# Patient Record
Sex: Female | Born: 1950 | Race: White | Hispanic: No
Health system: Southern US, Community
[De-identification: ages and names within clinical notes are randomized; demographics above are authoritative.]

---

## 2016-10-17 DIAGNOSIS — D1809 Hemangioma of other sites: Secondary | ICD-10-CM | POA: Diagnosis not present

## 2016-10-17 DIAGNOSIS — R2689 Other abnormalities of gait and mobility: Secondary | ICD-10-CM | POA: Diagnosis not present

## 2016-10-17 DIAGNOSIS — Z96642 Presence of left artificial hip joint: Secondary | ICD-10-CM | POA: Diagnosis not present

## 2016-10-17 DIAGNOSIS — M25552 Pain in left hip: Secondary | ICD-10-CM | POA: Diagnosis not present

## 2016-10-19 DIAGNOSIS — R2689 Other abnormalities of gait and mobility: Secondary | ICD-10-CM | POA: Diagnosis not present

## 2016-10-19 DIAGNOSIS — Z96642 Presence of left artificial hip joint: Secondary | ICD-10-CM | POA: Diagnosis not present

## 2016-10-19 DIAGNOSIS — D1809 Hemangioma of other sites: Secondary | ICD-10-CM | POA: Diagnosis not present

## 2016-10-19 DIAGNOSIS — M25552 Pain in left hip: Secondary | ICD-10-CM | POA: Diagnosis not present

## 2016-10-24 DIAGNOSIS — D1809 Hemangioma of other sites: Secondary | ICD-10-CM | POA: Diagnosis not present

## 2016-10-24 DIAGNOSIS — Z96642 Presence of left artificial hip joint: Secondary | ICD-10-CM | POA: Diagnosis not present

## 2016-10-24 DIAGNOSIS — R2689 Other abnormalities of gait and mobility: Secondary | ICD-10-CM | POA: Diagnosis not present

## 2016-10-24 DIAGNOSIS — M25552 Pain in left hip: Secondary | ICD-10-CM | POA: Diagnosis not present

## 2016-10-26 DIAGNOSIS — M25552 Pain in left hip: Secondary | ICD-10-CM | POA: Diagnosis not present

## 2016-10-26 DIAGNOSIS — R2689 Other abnormalities of gait and mobility: Secondary | ICD-10-CM | POA: Diagnosis not present

## 2016-10-26 DIAGNOSIS — D1809 Hemangioma of other sites: Secondary | ICD-10-CM | POA: Diagnosis not present

## 2016-10-26 DIAGNOSIS — Z96642 Presence of left artificial hip joint: Secondary | ICD-10-CM | POA: Diagnosis not present

## 2016-10-31 DIAGNOSIS — D1809 Hemangioma of other sites: Secondary | ICD-10-CM | POA: Diagnosis not present

## 2016-10-31 DIAGNOSIS — M25552 Pain in left hip: Secondary | ICD-10-CM | POA: Diagnosis not present

## 2016-10-31 DIAGNOSIS — Z96642 Presence of left artificial hip joint: Secondary | ICD-10-CM | POA: Diagnosis not present

## 2016-10-31 DIAGNOSIS — R2689 Other abnormalities of gait and mobility: Secondary | ICD-10-CM | POA: Diagnosis not present

## 2016-11-02 DIAGNOSIS — Z96642 Presence of left artificial hip joint: Secondary | ICD-10-CM | POA: Diagnosis not present

## 2016-11-02 DIAGNOSIS — R2689 Other abnormalities of gait and mobility: Secondary | ICD-10-CM | POA: Diagnosis not present

## 2016-11-02 DIAGNOSIS — M25552 Pain in left hip: Secondary | ICD-10-CM | POA: Diagnosis not present

## 2016-11-02 DIAGNOSIS — D1809 Hemangioma of other sites: Secondary | ICD-10-CM | POA: Diagnosis not present

## 2016-11-07 DIAGNOSIS — M25552 Pain in left hip: Secondary | ICD-10-CM | POA: Diagnosis not present

## 2016-11-07 DIAGNOSIS — Z96642 Presence of left artificial hip joint: Secondary | ICD-10-CM | POA: Diagnosis not present

## 2016-11-07 DIAGNOSIS — R2689 Other abnormalities of gait and mobility: Secondary | ICD-10-CM | POA: Diagnosis not present

## 2016-11-07 DIAGNOSIS — D1809 Hemangioma of other sites: Secondary | ICD-10-CM | POA: Diagnosis not present

## 2016-11-14 DIAGNOSIS — M25552 Pain in left hip: Secondary | ICD-10-CM | POA: Diagnosis not present

## 2016-11-14 DIAGNOSIS — D1809 Hemangioma of other sites: Secondary | ICD-10-CM | POA: Diagnosis not present

## 2016-11-14 DIAGNOSIS — Z96642 Presence of left artificial hip joint: Secondary | ICD-10-CM | POA: Diagnosis not present

## 2016-11-14 DIAGNOSIS — R2689 Other abnormalities of gait and mobility: Secondary | ICD-10-CM | POA: Diagnosis not present

## 2016-11-16 DIAGNOSIS — D1809 Hemangioma of other sites: Secondary | ICD-10-CM | POA: Diagnosis not present

## 2016-11-16 DIAGNOSIS — M25552 Pain in left hip: Secondary | ICD-10-CM | POA: Diagnosis not present

## 2016-11-16 DIAGNOSIS — R2689 Other abnormalities of gait and mobility: Secondary | ICD-10-CM | POA: Diagnosis not present

## 2016-11-16 DIAGNOSIS — Z96642 Presence of left artificial hip joint: Secondary | ICD-10-CM | POA: Diagnosis not present

## 2016-11-21 DIAGNOSIS — D1809 Hemangioma of other sites: Secondary | ICD-10-CM | POA: Diagnosis not present

## 2016-11-21 DIAGNOSIS — R2689 Other abnormalities of gait and mobility: Secondary | ICD-10-CM | POA: Diagnosis not present

## 2016-11-21 DIAGNOSIS — M25552 Pain in left hip: Secondary | ICD-10-CM | POA: Diagnosis not present

## 2016-11-21 DIAGNOSIS — Z96642 Presence of left artificial hip joint: Secondary | ICD-10-CM | POA: Diagnosis not present

## 2016-11-28 DIAGNOSIS — M25552 Pain in left hip: Secondary | ICD-10-CM | POA: Diagnosis not present

## 2016-11-28 DIAGNOSIS — D1809 Hemangioma of other sites: Secondary | ICD-10-CM | POA: Diagnosis not present

## 2016-11-28 DIAGNOSIS — R2689 Other abnormalities of gait and mobility: Secondary | ICD-10-CM | POA: Diagnosis not present

## 2016-11-28 DIAGNOSIS — Z96642 Presence of left artificial hip joint: Secondary | ICD-10-CM | POA: Diagnosis not present

## 2016-11-30 DIAGNOSIS — Z96642 Presence of left artificial hip joint: Secondary | ICD-10-CM | POA: Diagnosis not present

## 2016-11-30 DIAGNOSIS — R2689 Other abnormalities of gait and mobility: Secondary | ICD-10-CM | POA: Diagnosis not present

## 2016-11-30 DIAGNOSIS — D1809 Hemangioma of other sites: Secondary | ICD-10-CM | POA: Diagnosis not present

## 2016-11-30 DIAGNOSIS — M25552 Pain in left hip: Secondary | ICD-10-CM | POA: Diagnosis not present

## 2016-12-05 DIAGNOSIS — D1809 Hemangioma of other sites: Secondary | ICD-10-CM | POA: Diagnosis not present

## 2016-12-05 DIAGNOSIS — Z96642 Presence of left artificial hip joint: Secondary | ICD-10-CM | POA: Diagnosis not present

## 2016-12-05 DIAGNOSIS — M25552 Pain in left hip: Secondary | ICD-10-CM | POA: Diagnosis not present

## 2016-12-05 DIAGNOSIS — R2689 Other abnormalities of gait and mobility: Secondary | ICD-10-CM | POA: Diagnosis not present

## 2016-12-07 DIAGNOSIS — Z96642 Presence of left artificial hip joint: Secondary | ICD-10-CM | POA: Diagnosis not present

## 2016-12-07 DIAGNOSIS — D1809 Hemangioma of other sites: Secondary | ICD-10-CM | POA: Diagnosis not present

## 2016-12-07 DIAGNOSIS — R2689 Other abnormalities of gait and mobility: Secondary | ICD-10-CM | POA: Diagnosis not present

## 2016-12-07 DIAGNOSIS — M25552 Pain in left hip: Secondary | ICD-10-CM | POA: Diagnosis not present

## 2016-12-13 DIAGNOSIS — R2689 Other abnormalities of gait and mobility: Secondary | ICD-10-CM | POA: Diagnosis not present

## 2016-12-13 DIAGNOSIS — Z96642 Presence of left artificial hip joint: Secondary | ICD-10-CM | POA: Diagnosis not present

## 2016-12-13 DIAGNOSIS — M25552 Pain in left hip: Secondary | ICD-10-CM | POA: Diagnosis not present

## 2016-12-13 DIAGNOSIS — D1809 Hemangioma of other sites: Secondary | ICD-10-CM | POA: Diagnosis not present

## 2016-12-19 DIAGNOSIS — M25552 Pain in left hip: Secondary | ICD-10-CM | POA: Diagnosis not present

## 2016-12-19 DIAGNOSIS — R2689 Other abnormalities of gait and mobility: Secondary | ICD-10-CM | POA: Diagnosis not present

## 2016-12-19 DIAGNOSIS — Z96642 Presence of left artificial hip joint: Secondary | ICD-10-CM | POA: Diagnosis not present

## 2016-12-19 DIAGNOSIS — D1809 Hemangioma of other sites: Secondary | ICD-10-CM | POA: Diagnosis not present

## 2016-12-21 DIAGNOSIS — M25552 Pain in left hip: Secondary | ICD-10-CM | POA: Diagnosis not present

## 2016-12-21 DIAGNOSIS — R2689 Other abnormalities of gait and mobility: Secondary | ICD-10-CM | POA: Diagnosis not present

## 2016-12-21 DIAGNOSIS — Z96642 Presence of left artificial hip joint: Secondary | ICD-10-CM | POA: Diagnosis not present

## 2016-12-21 DIAGNOSIS — D1809 Hemangioma of other sites: Secondary | ICD-10-CM | POA: Diagnosis not present

## 2016-12-26 DIAGNOSIS — Z96642 Presence of left artificial hip joint: Secondary | ICD-10-CM | POA: Diagnosis not present

## 2016-12-26 DIAGNOSIS — D1809 Hemangioma of other sites: Secondary | ICD-10-CM | POA: Diagnosis not present

## 2016-12-26 DIAGNOSIS — M25552 Pain in left hip: Secondary | ICD-10-CM | POA: Diagnosis not present

## 2016-12-26 DIAGNOSIS — R2689 Other abnormalities of gait and mobility: Secondary | ICD-10-CM | POA: Diagnosis not present

## 2016-12-28 DIAGNOSIS — M25552 Pain in left hip: Secondary | ICD-10-CM | POA: Diagnosis not present

## 2016-12-28 DIAGNOSIS — D1809 Hemangioma of other sites: Secondary | ICD-10-CM | POA: Diagnosis not present

## 2016-12-28 DIAGNOSIS — R2689 Other abnormalities of gait and mobility: Secondary | ICD-10-CM | POA: Diagnosis not present

## 2016-12-28 DIAGNOSIS — Z96642 Presence of left artificial hip joint: Secondary | ICD-10-CM | POA: Diagnosis not present

## 2017-01-02 DIAGNOSIS — Z96642 Presence of left artificial hip joint: Secondary | ICD-10-CM | POA: Diagnosis not present

## 2017-01-02 DIAGNOSIS — D1809 Hemangioma of other sites: Secondary | ICD-10-CM | POA: Diagnosis not present

## 2017-01-02 DIAGNOSIS — M25552 Pain in left hip: Secondary | ICD-10-CM | POA: Diagnosis not present

## 2017-01-02 DIAGNOSIS — R2689 Other abnormalities of gait and mobility: Secondary | ICD-10-CM | POA: Diagnosis not present

## 2017-01-09 DIAGNOSIS — R2689 Other abnormalities of gait and mobility: Secondary | ICD-10-CM | POA: Diagnosis not present

## 2017-01-09 DIAGNOSIS — Z96642 Presence of left artificial hip joint: Secondary | ICD-10-CM | POA: Diagnosis not present

## 2017-01-09 DIAGNOSIS — D1809 Hemangioma of other sites: Secondary | ICD-10-CM | POA: Diagnosis not present

## 2017-01-09 DIAGNOSIS — M25552 Pain in left hip: Secondary | ICD-10-CM | POA: Diagnosis not present

## 2017-01-11 DIAGNOSIS — D1809 Hemangioma of other sites: Secondary | ICD-10-CM | POA: Diagnosis not present

## 2017-01-11 DIAGNOSIS — R2689 Other abnormalities of gait and mobility: Secondary | ICD-10-CM | POA: Diagnosis not present

## 2017-01-11 DIAGNOSIS — Z96642 Presence of left artificial hip joint: Secondary | ICD-10-CM | POA: Diagnosis not present

## 2017-01-11 DIAGNOSIS — M25552 Pain in left hip: Secondary | ICD-10-CM | POA: Diagnosis not present

## 2017-01-16 DIAGNOSIS — Z96642 Presence of left artificial hip joint: Secondary | ICD-10-CM | POA: Diagnosis not present

## 2017-01-16 DIAGNOSIS — D1809 Hemangioma of other sites: Secondary | ICD-10-CM | POA: Diagnosis not present

## 2017-01-16 DIAGNOSIS — M25552 Pain in left hip: Secondary | ICD-10-CM | POA: Diagnosis not present

## 2017-01-16 DIAGNOSIS — R2689 Other abnormalities of gait and mobility: Secondary | ICD-10-CM | POA: Diagnosis not present

## 2017-01-18 DIAGNOSIS — D1809 Hemangioma of other sites: Secondary | ICD-10-CM | POA: Diagnosis not present

## 2017-01-18 DIAGNOSIS — M25552 Pain in left hip: Secondary | ICD-10-CM | POA: Diagnosis not present

## 2017-01-18 DIAGNOSIS — R2689 Other abnormalities of gait and mobility: Secondary | ICD-10-CM | POA: Diagnosis not present

## 2017-01-18 DIAGNOSIS — Z96642 Presence of left artificial hip joint: Secondary | ICD-10-CM | POA: Diagnosis not present

## 2017-01-23 DIAGNOSIS — R2689 Other abnormalities of gait and mobility: Secondary | ICD-10-CM | POA: Diagnosis not present

## 2017-01-23 DIAGNOSIS — Z96642 Presence of left artificial hip joint: Secondary | ICD-10-CM | POA: Diagnosis not present

## 2017-01-23 DIAGNOSIS — M25552 Pain in left hip: Secondary | ICD-10-CM | POA: Diagnosis not present

## 2017-01-23 DIAGNOSIS — D1809 Hemangioma of other sites: Secondary | ICD-10-CM | POA: Diagnosis not present

## 2017-01-25 DIAGNOSIS — R2689 Other abnormalities of gait and mobility: Secondary | ICD-10-CM | POA: Diagnosis not present

## 2017-01-25 DIAGNOSIS — M25552 Pain in left hip: Secondary | ICD-10-CM | POA: Diagnosis not present

## 2017-01-25 DIAGNOSIS — D1809 Hemangioma of other sites: Secondary | ICD-10-CM | POA: Diagnosis not present

## 2017-01-25 DIAGNOSIS — Z96642 Presence of left artificial hip joint: Secondary | ICD-10-CM | POA: Diagnosis not present

## 2017-01-27 DIAGNOSIS — D1800 Hemangioma unspecified site: Secondary | ICD-10-CM | POA: Diagnosis not present

## 2017-01-27 DIAGNOSIS — Z87891 Personal history of nicotine dependence: Secondary | ICD-10-CM | POA: Diagnosis not present

## 2017-01-27 DIAGNOSIS — M899 Disorder of bone, unspecified: Secondary | ICD-10-CM | POA: Diagnosis not present

## 2017-01-27 DIAGNOSIS — T8131XA Disruption of external operation (surgical) wound, not elsewhere classified, initial encounter: Secondary | ICD-10-CM | POA: Diagnosis not present

## 2017-01-27 DIAGNOSIS — Z79899 Other long term (current) drug therapy: Secondary | ICD-10-CM | POA: Diagnosis not present

## 2017-01-27 DIAGNOSIS — C419 Malignant neoplasm of bone and articular cartilage, unspecified: Secondary | ICD-10-CM | POA: Diagnosis not present

## 2017-01-27 DIAGNOSIS — J45909 Unspecified asthma, uncomplicated: Secondary | ICD-10-CM | POA: Diagnosis not present

## 2017-01-27 DIAGNOSIS — E785 Hyperlipidemia, unspecified: Secondary | ICD-10-CM | POA: Diagnosis not present

## 2017-01-27 DIAGNOSIS — Z96642 Presence of left artificial hip joint: Secondary | ICD-10-CM | POA: Diagnosis not present

## 2017-01-27 DIAGNOSIS — Z471 Aftercare following joint replacement surgery: Secondary | ICD-10-CM | POA: Diagnosis not present

## 2017-01-30 DIAGNOSIS — M899 Disorder of bone, unspecified: Secondary | ICD-10-CM | POA: Diagnosis not present

## 2017-01-30 DIAGNOSIS — J45909 Unspecified asthma, uncomplicated: Secondary | ICD-10-CM | POA: Diagnosis not present

## 2017-01-30 DIAGNOSIS — C419 Malignant neoplasm of bone and articular cartilage, unspecified: Secondary | ICD-10-CM | POA: Diagnosis not present

## 2017-01-30 DIAGNOSIS — Z87891 Personal history of nicotine dependence: Secondary | ICD-10-CM | POA: Diagnosis not present

## 2017-01-30 DIAGNOSIS — D1800 Hemangioma unspecified site: Secondary | ICD-10-CM | POA: Diagnosis not present

## 2017-01-30 DIAGNOSIS — T8131XA Disruption of external operation (surgical) wound, not elsewhere classified, initial encounter: Secondary | ICD-10-CM | POA: Diagnosis not present

## 2017-01-30 DIAGNOSIS — Z79899 Other long term (current) drug therapy: Secondary | ICD-10-CM | POA: Diagnosis not present

## 2017-01-30 DIAGNOSIS — Z96642 Presence of left artificial hip joint: Secondary | ICD-10-CM | POA: Diagnosis not present

## 2017-01-30 DIAGNOSIS — Z471 Aftercare following joint replacement surgery: Secondary | ICD-10-CM | POA: Diagnosis not present

## 2017-01-30 DIAGNOSIS — E785 Hyperlipidemia, unspecified: Secondary | ICD-10-CM | POA: Diagnosis not present

## 2017-02-01 DIAGNOSIS — Z96642 Presence of left artificial hip joint: Secondary | ICD-10-CM | POA: Diagnosis not present

## 2017-02-01 DIAGNOSIS — R2689 Other abnormalities of gait and mobility: Secondary | ICD-10-CM | POA: Diagnosis not present

## 2017-02-01 DIAGNOSIS — D1809 Hemangioma of other sites: Secondary | ICD-10-CM | POA: Diagnosis not present

## 2017-02-01 DIAGNOSIS — M25552 Pain in left hip: Secondary | ICD-10-CM | POA: Diagnosis not present

## 2017-02-06 DIAGNOSIS — M25552 Pain in left hip: Secondary | ICD-10-CM | POA: Diagnosis not present

## 2017-02-06 DIAGNOSIS — R2689 Other abnormalities of gait and mobility: Secondary | ICD-10-CM | POA: Diagnosis not present

## 2017-02-06 DIAGNOSIS — D1809 Hemangioma of other sites: Secondary | ICD-10-CM | POA: Diagnosis not present

## 2017-02-06 DIAGNOSIS — Z96642 Presence of left artificial hip joint: Secondary | ICD-10-CM | POA: Diagnosis not present

## 2017-02-13 DIAGNOSIS — R2689 Other abnormalities of gait and mobility: Secondary | ICD-10-CM | POA: Diagnosis not present

## 2017-02-13 DIAGNOSIS — D1809 Hemangioma of other sites: Secondary | ICD-10-CM | POA: Diagnosis not present

## 2017-02-13 DIAGNOSIS — M25552 Pain in left hip: Secondary | ICD-10-CM | POA: Diagnosis not present

## 2017-02-13 DIAGNOSIS — Z96642 Presence of left artificial hip joint: Secondary | ICD-10-CM | POA: Diagnosis not present

## 2017-02-15 DIAGNOSIS — D1809 Hemangioma of other sites: Secondary | ICD-10-CM | POA: Diagnosis not present

## 2017-02-15 DIAGNOSIS — M25552 Pain in left hip: Secondary | ICD-10-CM | POA: Diagnosis not present

## 2017-02-15 DIAGNOSIS — Z96642 Presence of left artificial hip joint: Secondary | ICD-10-CM | POA: Diagnosis not present

## 2017-02-15 DIAGNOSIS — R2689 Other abnormalities of gait and mobility: Secondary | ICD-10-CM | POA: Diagnosis not present

## 2017-02-20 DIAGNOSIS — R2689 Other abnormalities of gait and mobility: Secondary | ICD-10-CM | POA: Diagnosis not present

## 2017-02-20 DIAGNOSIS — D1809 Hemangioma of other sites: Secondary | ICD-10-CM | POA: Diagnosis not present

## 2017-02-20 DIAGNOSIS — M25552 Pain in left hip: Secondary | ICD-10-CM | POA: Diagnosis not present

## 2017-02-20 DIAGNOSIS — Z96642 Presence of left artificial hip joint: Secondary | ICD-10-CM | POA: Diagnosis not present

## 2017-02-22 DIAGNOSIS — R2689 Other abnormalities of gait and mobility: Secondary | ICD-10-CM | POA: Diagnosis not present

## 2017-02-22 DIAGNOSIS — M25552 Pain in left hip: Secondary | ICD-10-CM | POA: Diagnosis not present

## 2017-02-22 DIAGNOSIS — Z96642 Presence of left artificial hip joint: Secondary | ICD-10-CM | POA: Diagnosis not present

## 2017-02-22 DIAGNOSIS — D1809 Hemangioma of other sites: Secondary | ICD-10-CM | POA: Diagnosis not present

## 2017-02-27 DIAGNOSIS — D1809 Hemangioma of other sites: Secondary | ICD-10-CM | POA: Diagnosis not present

## 2017-02-27 DIAGNOSIS — R2689 Other abnormalities of gait and mobility: Secondary | ICD-10-CM | POA: Diagnosis not present

## 2017-02-27 DIAGNOSIS — M25552 Pain in left hip: Secondary | ICD-10-CM | POA: Diagnosis not present

## 2017-02-27 DIAGNOSIS — Z96642 Presence of left artificial hip joint: Secondary | ICD-10-CM | POA: Diagnosis not present

## 2017-02-28 DIAGNOSIS — M791 Myalgia: Secondary | ICD-10-CM | POA: Diagnosis not present

## 2017-02-28 DIAGNOSIS — M5430 Sciatica, unspecified side: Secondary | ICD-10-CM | POA: Diagnosis not present

## 2017-02-28 DIAGNOSIS — G9050 Complex regional pain syndrome I, unspecified: Secondary | ICD-10-CM | POA: Diagnosis not present

## 2017-02-28 DIAGNOSIS — C419 Malignant neoplasm of bone and articular cartilage, unspecified: Secondary | ICD-10-CM | POA: Diagnosis not present

## 2017-02-28 DIAGNOSIS — M79662 Pain in left lower leg: Secondary | ICD-10-CM | POA: Diagnosis not present

## 2017-02-28 DIAGNOSIS — G5710 Meralgia paresthetica, unspecified lower limb: Secondary | ICD-10-CM | POA: Diagnosis not present

## 2017-02-28 DIAGNOSIS — Z806 Family history of leukemia: Secondary | ICD-10-CM | POA: Diagnosis not present

## 2017-02-28 DIAGNOSIS — Z801 Family history of malignant neoplasm of trachea, bronchus and lung: Secondary | ICD-10-CM | POA: Diagnosis not present

## 2017-02-28 DIAGNOSIS — M792 Neuralgia and neuritis, unspecified: Secondary | ICD-10-CM | POA: Diagnosis not present

## 2017-03-08 DIAGNOSIS — D1800 Hemangioma unspecified site: Secondary | ICD-10-CM | POA: Diagnosis not present

## 2017-03-08 DIAGNOSIS — G5710 Meralgia paresthetica, unspecified lower limb: Secondary | ICD-10-CM | POA: Diagnosis not present

## 2017-03-08 DIAGNOSIS — C419 Malignant neoplasm of bone and articular cartilage, unspecified: Secondary | ICD-10-CM | POA: Diagnosis not present

## 2017-03-08 DIAGNOSIS — M792 Neuralgia and neuritis, unspecified: Secondary | ICD-10-CM | POA: Diagnosis not present

## 2017-03-08 DIAGNOSIS — G9050 Complex regional pain syndrome I, unspecified: Secondary | ICD-10-CM | POA: Diagnosis not present

## 2017-03-08 DIAGNOSIS — M791 Myalgia: Secondary | ICD-10-CM | POA: Diagnosis not present

## 2017-03-08 DIAGNOSIS — M79605 Pain in left leg: Secondary | ICD-10-CM | POA: Diagnosis not present

## 2017-03-13 DIAGNOSIS — M25552 Pain in left hip: Secondary | ICD-10-CM | POA: Diagnosis not present

## 2017-03-13 DIAGNOSIS — D1809 Hemangioma of other sites: Secondary | ICD-10-CM | POA: Diagnosis not present

## 2017-03-13 DIAGNOSIS — Z96642 Presence of left artificial hip joint: Secondary | ICD-10-CM | POA: Diagnosis not present

## 2017-03-13 DIAGNOSIS — R2689 Other abnormalities of gait and mobility: Secondary | ICD-10-CM | POA: Diagnosis not present

## 2017-03-20 DIAGNOSIS — R2689 Other abnormalities of gait and mobility: Secondary | ICD-10-CM | POA: Diagnosis not present

## 2017-03-20 DIAGNOSIS — M25552 Pain in left hip: Secondary | ICD-10-CM | POA: Diagnosis not present

## 2017-03-20 DIAGNOSIS — Z96642 Presence of left artificial hip joint: Secondary | ICD-10-CM | POA: Diagnosis not present

## 2017-03-20 DIAGNOSIS — D1809 Hemangioma of other sites: Secondary | ICD-10-CM | POA: Diagnosis not present

## 2017-03-22 DIAGNOSIS — D1809 Hemangioma of other sites: Secondary | ICD-10-CM | POA: Diagnosis not present

## 2017-03-22 DIAGNOSIS — Z96642 Presence of left artificial hip joint: Secondary | ICD-10-CM | POA: Diagnosis not present

## 2017-03-22 DIAGNOSIS — M25552 Pain in left hip: Secondary | ICD-10-CM | POA: Diagnosis not present

## 2017-03-22 DIAGNOSIS — R2689 Other abnormalities of gait and mobility: Secondary | ICD-10-CM | POA: Diagnosis not present

## 2017-03-27 DIAGNOSIS — M25552 Pain in left hip: Secondary | ICD-10-CM | POA: Diagnosis not present

## 2017-03-27 DIAGNOSIS — Z96642 Presence of left artificial hip joint: Secondary | ICD-10-CM | POA: Diagnosis not present

## 2017-03-27 DIAGNOSIS — R2689 Other abnormalities of gait and mobility: Secondary | ICD-10-CM | POA: Diagnosis not present

## 2017-03-27 DIAGNOSIS — D1809 Hemangioma of other sites: Secondary | ICD-10-CM | POA: Diagnosis not present

## 2017-03-29 DIAGNOSIS — R2689 Other abnormalities of gait and mobility: Secondary | ICD-10-CM | POA: Diagnosis not present

## 2017-03-29 DIAGNOSIS — D1809 Hemangioma of other sites: Secondary | ICD-10-CM | POA: Diagnosis not present

## 2017-03-29 DIAGNOSIS — M25552 Pain in left hip: Secondary | ICD-10-CM | POA: Diagnosis not present

## 2017-03-29 DIAGNOSIS — Z96642 Presence of left artificial hip joint: Secondary | ICD-10-CM | POA: Diagnosis not present

## 2017-04-03 DIAGNOSIS — Z96642 Presence of left artificial hip joint: Secondary | ICD-10-CM | POA: Diagnosis not present

## 2017-04-03 DIAGNOSIS — D1809 Hemangioma of other sites: Secondary | ICD-10-CM | POA: Diagnosis not present

## 2017-04-03 DIAGNOSIS — M25552 Pain in left hip: Secondary | ICD-10-CM | POA: Diagnosis not present

## 2017-04-03 DIAGNOSIS — R2689 Other abnormalities of gait and mobility: Secondary | ICD-10-CM | POA: Diagnosis not present

## 2017-04-04 DIAGNOSIS — Z86018 Personal history of other benign neoplasm: Secondary | ICD-10-CM | POA: Diagnosis not present

## 2017-04-04 DIAGNOSIS — M792 Neuralgia and neuritis, unspecified: Secondary | ICD-10-CM | POA: Diagnosis not present

## 2017-04-04 DIAGNOSIS — M791 Myalgia: Secondary | ICD-10-CM | POA: Diagnosis not present

## 2017-04-04 DIAGNOSIS — C419 Malignant neoplasm of bone and articular cartilage, unspecified: Secondary | ICD-10-CM | POA: Diagnosis not present

## 2017-04-04 DIAGNOSIS — J45909 Unspecified asthma, uncomplicated: Secondary | ICD-10-CM | POA: Diagnosis not present

## 2017-04-04 DIAGNOSIS — G5710 Meralgia paresthetica, unspecified lower limb: Secondary | ICD-10-CM | POA: Diagnosis not present

## 2017-04-04 DIAGNOSIS — G9050 Complex regional pain syndrome I, unspecified: Secondary | ICD-10-CM | POA: Diagnosis not present

## 2017-04-04 DIAGNOSIS — M79662 Pain in left lower leg: Secondary | ICD-10-CM | POA: Diagnosis not present

## 2017-04-10 DIAGNOSIS — D1809 Hemangioma of other sites: Secondary | ICD-10-CM | POA: Diagnosis not present

## 2017-04-10 DIAGNOSIS — R2689 Other abnormalities of gait and mobility: Secondary | ICD-10-CM | POA: Diagnosis not present

## 2017-04-10 DIAGNOSIS — M25552 Pain in left hip: Secondary | ICD-10-CM | POA: Diagnosis not present

## 2017-04-10 DIAGNOSIS — Z96642 Presence of left artificial hip joint: Secondary | ICD-10-CM | POA: Diagnosis not present

## 2017-04-19 DIAGNOSIS — Z96642 Presence of left artificial hip joint: Secondary | ICD-10-CM | POA: Diagnosis not present

## 2017-04-19 DIAGNOSIS — M25552 Pain in left hip: Secondary | ICD-10-CM | POA: Diagnosis not present

## 2017-04-19 DIAGNOSIS — D1809 Hemangioma of other sites: Secondary | ICD-10-CM | POA: Diagnosis not present

## 2017-04-19 DIAGNOSIS — R2689 Other abnormalities of gait and mobility: Secondary | ICD-10-CM | POA: Diagnosis not present

## 2017-04-24 DIAGNOSIS — R2689 Other abnormalities of gait and mobility: Secondary | ICD-10-CM | POA: Diagnosis not present

## 2017-04-24 DIAGNOSIS — M25552 Pain in left hip: Secondary | ICD-10-CM | POA: Diagnosis not present

## 2017-04-24 DIAGNOSIS — D1809 Hemangioma of other sites: Secondary | ICD-10-CM | POA: Diagnosis not present

## 2017-04-24 DIAGNOSIS — Z96642 Presence of left artificial hip joint: Secondary | ICD-10-CM | POA: Diagnosis not present

## 2017-04-26 DIAGNOSIS — R2689 Other abnormalities of gait and mobility: Secondary | ICD-10-CM | POA: Diagnosis not present

## 2017-04-26 DIAGNOSIS — M25552 Pain in left hip: Secondary | ICD-10-CM | POA: Diagnosis not present

## 2017-04-26 DIAGNOSIS — D1809 Hemangioma of other sites: Secondary | ICD-10-CM | POA: Diagnosis not present

## 2017-04-26 DIAGNOSIS — Z96642 Presence of left artificial hip joint: Secondary | ICD-10-CM | POA: Diagnosis not present

## 2017-04-27 DIAGNOSIS — G5702 Lesion of sciatic nerve, left lower limb: Secondary | ICD-10-CM | POA: Diagnosis not present

## 2017-04-27 DIAGNOSIS — M792 Neuralgia and neuritis, unspecified: Secondary | ICD-10-CM | POA: Diagnosis not present

## 2017-04-27 DIAGNOSIS — Z86018 Personal history of other benign neoplasm: Secondary | ICD-10-CM | POA: Diagnosis not present

## 2017-04-27 DIAGNOSIS — J45909 Unspecified asthma, uncomplicated: Secondary | ICD-10-CM | POA: Diagnosis not present

## 2017-04-27 DIAGNOSIS — M79605 Pain in left leg: Secondary | ICD-10-CM | POA: Diagnosis not present

## 2017-04-27 DIAGNOSIS — G5710 Meralgia paresthetica, unspecified lower limb: Secondary | ICD-10-CM | POA: Diagnosis not present

## 2017-04-27 DIAGNOSIS — C419 Malignant neoplasm of bone and articular cartilage, unspecified: Secondary | ICD-10-CM | POA: Diagnosis not present

## 2017-04-27 DIAGNOSIS — M791 Myalgia: Secondary | ICD-10-CM | POA: Diagnosis not present

## 2017-04-27 DIAGNOSIS — G9050 Complex regional pain syndrome I, unspecified: Secondary | ICD-10-CM | POA: Diagnosis not present

## 2017-05-01 DIAGNOSIS — R2689 Other abnormalities of gait and mobility: Secondary | ICD-10-CM | POA: Diagnosis not present

## 2017-05-01 DIAGNOSIS — D1809 Hemangioma of other sites: Secondary | ICD-10-CM | POA: Diagnosis not present

## 2017-05-01 DIAGNOSIS — Z96642 Presence of left artificial hip joint: Secondary | ICD-10-CM | POA: Diagnosis not present

## 2017-05-01 DIAGNOSIS — M25552 Pain in left hip: Secondary | ICD-10-CM | POA: Diagnosis not present

## 2017-05-03 DIAGNOSIS — D1800 Hemangioma unspecified site: Secondary | ICD-10-CM | POA: Diagnosis not present

## 2017-05-03 DIAGNOSIS — J45909 Unspecified asthma, uncomplicated: Secondary | ICD-10-CM | POA: Diagnosis not present

## 2017-05-07 DIAGNOSIS — D519 Vitamin B12 deficiency anemia, unspecified: Secondary | ICD-10-CM | POA: Diagnosis not present

## 2017-05-07 DIAGNOSIS — R5383 Other fatigue: Secondary | ICD-10-CM | POA: Diagnosis not present

## 2017-05-07 DIAGNOSIS — R634 Abnormal weight loss: Secondary | ICD-10-CM | POA: Diagnosis not present

## 2017-05-08 DIAGNOSIS — D1809 Hemangioma of other sites: Secondary | ICD-10-CM | POA: Diagnosis not present

## 2017-05-08 DIAGNOSIS — Z96642 Presence of left artificial hip joint: Secondary | ICD-10-CM | POA: Diagnosis not present

## 2017-05-08 DIAGNOSIS — M25552 Pain in left hip: Secondary | ICD-10-CM | POA: Diagnosis not present

## 2017-05-08 DIAGNOSIS — R2689 Other abnormalities of gait and mobility: Secondary | ICD-10-CM | POA: Diagnosis not present

## 2017-05-10 DIAGNOSIS — M25552 Pain in left hip: Secondary | ICD-10-CM | POA: Diagnosis not present

## 2017-05-10 DIAGNOSIS — Z96642 Presence of left artificial hip joint: Secondary | ICD-10-CM | POA: Diagnosis not present

## 2017-05-10 DIAGNOSIS — R2689 Other abnormalities of gait and mobility: Secondary | ICD-10-CM | POA: Diagnosis not present

## 2017-05-10 DIAGNOSIS — D1809 Hemangioma of other sites: Secondary | ICD-10-CM | POA: Diagnosis not present

## 2017-05-15 DIAGNOSIS — R2689 Other abnormalities of gait and mobility: Secondary | ICD-10-CM | POA: Diagnosis not present

## 2017-05-15 DIAGNOSIS — Z96642 Presence of left artificial hip joint: Secondary | ICD-10-CM | POA: Diagnosis not present

## 2017-05-15 DIAGNOSIS — M25552 Pain in left hip: Secondary | ICD-10-CM | POA: Diagnosis not present

## 2017-05-15 DIAGNOSIS — D1809 Hemangioma of other sites: Secondary | ICD-10-CM | POA: Diagnosis not present

## 2017-05-17 DIAGNOSIS — M25552 Pain in left hip: Secondary | ICD-10-CM | POA: Diagnosis not present

## 2017-05-17 DIAGNOSIS — D1809 Hemangioma of other sites: Secondary | ICD-10-CM | POA: Diagnosis not present

## 2017-05-17 DIAGNOSIS — R2689 Other abnormalities of gait and mobility: Secondary | ICD-10-CM | POA: Diagnosis not present

## 2017-05-17 DIAGNOSIS — Z96642 Presence of left artificial hip joint: Secondary | ICD-10-CM | POA: Diagnosis not present

## 2017-05-22 DIAGNOSIS — Z96642 Presence of left artificial hip joint: Secondary | ICD-10-CM | POA: Diagnosis not present

## 2017-05-22 DIAGNOSIS — D1809 Hemangioma of other sites: Secondary | ICD-10-CM | POA: Diagnosis not present

## 2017-05-22 DIAGNOSIS — R2689 Other abnormalities of gait and mobility: Secondary | ICD-10-CM | POA: Diagnosis not present

## 2017-05-22 DIAGNOSIS — M25552 Pain in left hip: Secondary | ICD-10-CM | POA: Diagnosis not present

## 2017-05-24 DIAGNOSIS — M25552 Pain in left hip: Secondary | ICD-10-CM | POA: Diagnosis not present

## 2017-05-24 DIAGNOSIS — R2689 Other abnormalities of gait and mobility: Secondary | ICD-10-CM | POA: Diagnosis not present

## 2017-05-24 DIAGNOSIS — D1809 Hemangioma of other sites: Secondary | ICD-10-CM | POA: Diagnosis not present

## 2017-05-24 DIAGNOSIS — Z96642 Presence of left artificial hip joint: Secondary | ICD-10-CM | POA: Diagnosis not present

## 2017-05-29 DIAGNOSIS — M25552 Pain in left hip: Secondary | ICD-10-CM | POA: Diagnosis not present

## 2017-05-29 DIAGNOSIS — D1809 Hemangioma of other sites: Secondary | ICD-10-CM | POA: Diagnosis not present

## 2017-05-29 DIAGNOSIS — R2689 Other abnormalities of gait and mobility: Secondary | ICD-10-CM | POA: Diagnosis not present

## 2017-05-29 DIAGNOSIS — Z96642 Presence of left artificial hip joint: Secondary | ICD-10-CM | POA: Diagnosis not present

## 2017-05-31 DIAGNOSIS — R2689 Other abnormalities of gait and mobility: Secondary | ICD-10-CM | POA: Diagnosis not present

## 2017-05-31 DIAGNOSIS — M25552 Pain in left hip: Secondary | ICD-10-CM | POA: Diagnosis not present

## 2017-05-31 DIAGNOSIS — Z96642 Presence of left artificial hip joint: Secondary | ICD-10-CM | POA: Diagnosis not present

## 2017-05-31 DIAGNOSIS — D1809 Hemangioma of other sites: Secondary | ICD-10-CM | POA: Diagnosis not present

## 2017-07-09 DIAGNOSIS — Z23 Encounter for immunization: Secondary | ICD-10-CM | POA: Diagnosis not present

## 2017-08-07 DIAGNOSIS — D242 Benign neoplasm of left breast: Secondary | ICD-10-CM | POA: Diagnosis not present

## 2017-08-07 DIAGNOSIS — R922 Inconclusive mammogram: Secondary | ICD-10-CM | POA: Diagnosis not present

## 2017-08-07 DIAGNOSIS — Z1231 Encounter for screening mammogram for malignant neoplasm of breast: Secondary | ICD-10-CM | POA: Diagnosis not present

## 2017-08-16 DIAGNOSIS — R922 Inconclusive mammogram: Secondary | ICD-10-CM | POA: Diagnosis not present

## 2017-09-25 DIAGNOSIS — D1800 Hemangioma unspecified site: Secondary | ICD-10-CM | POA: Diagnosis not present

## 2017-09-25 DIAGNOSIS — M79662 Pain in left lower leg: Secondary | ICD-10-CM | POA: Diagnosis not present

## 2017-09-25 DIAGNOSIS — G5702 Lesion of sciatic nerve, left lower limb: Secondary | ICD-10-CM | POA: Diagnosis not present

## 2017-09-25 DIAGNOSIS — C419 Malignant neoplasm of bone and articular cartilage, unspecified: Secondary | ICD-10-CM | POA: Diagnosis not present

## 2017-09-25 DIAGNOSIS — Z471 Aftercare following joint replacement surgery: Secondary | ICD-10-CM | POA: Diagnosis not present

## 2017-09-25 DIAGNOSIS — C499 Malignant neoplasm of connective and soft tissue, unspecified: Secondary | ICD-10-CM | POA: Diagnosis not present

## 2017-09-25 DIAGNOSIS — Z86018 Personal history of other benign neoplasm: Secondary | ICD-10-CM | POA: Diagnosis not present

## 2017-09-25 DIAGNOSIS — M79652 Pain in left thigh: Secondary | ICD-10-CM | POA: Diagnosis not present

## 2017-09-25 DIAGNOSIS — M79605 Pain in left leg: Secondary | ICD-10-CM | POA: Diagnosis not present

## 2017-09-25 DIAGNOSIS — Z96649 Presence of unspecified artificial hip joint: Secondary | ICD-10-CM | POA: Diagnosis not present

## 2017-09-25 DIAGNOSIS — J45909 Unspecified asthma, uncomplicated: Secondary | ICD-10-CM | POA: Diagnosis not present

## 2017-12-03 DIAGNOSIS — G894 Chronic pain syndrome: Secondary | ICD-10-CM | POA: Diagnosis not present

## 2017-12-06 DIAGNOSIS — G894 Chronic pain syndrome: Secondary | ICD-10-CM | POA: Diagnosis not present

## 2017-12-06 DIAGNOSIS — M79672 Pain in left foot: Secondary | ICD-10-CM | POA: Diagnosis not present

## 2017-12-06 DIAGNOSIS — M792 Neuralgia and neuritis, unspecified: Secondary | ICD-10-CM | POA: Diagnosis not present

## 2018-01-04 DIAGNOSIS — G9050 Complex regional pain syndrome I, unspecified: Secondary | ICD-10-CM | POA: Diagnosis not present

## 2018-01-04 DIAGNOSIS — J45909 Unspecified asthma, uncomplicated: Secondary | ICD-10-CM | POA: Diagnosis not present

## 2018-03-16 DIAGNOSIS — J45909 Unspecified asthma, uncomplicated: Secondary | ICD-10-CM | POA: Diagnosis not present

## 2018-03-16 DIAGNOSIS — M79662 Pain in left lower leg: Secondary | ICD-10-CM | POA: Diagnosis not present

## 2018-03-16 DIAGNOSIS — M79652 Pain in left thigh: Secondary | ICD-10-CM | POA: Diagnosis not present

## 2018-03-16 DIAGNOSIS — G5702 Lesion of sciatic nerve, left lower limb: Secondary | ICD-10-CM | POA: Diagnosis not present

## 2018-03-16 DIAGNOSIS — C499 Malignant neoplasm of connective and soft tissue, unspecified: Secondary | ICD-10-CM | POA: Diagnosis not present

## 2018-03-16 DIAGNOSIS — D1801 Hemangioma of skin and subcutaneous tissue: Secondary | ICD-10-CM | POA: Diagnosis not present

## 2018-03-16 DIAGNOSIS — M79605 Pain in left leg: Secondary | ICD-10-CM | POA: Diagnosis not present

## 2018-03-16 DIAGNOSIS — Z86018 Personal history of other benign neoplasm: Secondary | ICD-10-CM | POA: Diagnosis not present

## 2018-03-16 DIAGNOSIS — C419 Malignant neoplasm of bone and articular cartilage, unspecified: Secondary | ICD-10-CM | POA: Diagnosis not present

## 2018-03-16 DIAGNOSIS — D1800 Hemangioma unspecified site: Secondary | ICD-10-CM | POA: Diagnosis not present

## 2018-03-19 DIAGNOSIS — G5702 Lesion of sciatic nerve, left lower limb: Secondary | ICD-10-CM | POA: Diagnosis not present

## 2018-03-19 DIAGNOSIS — D1801 Hemangioma of skin and subcutaneous tissue: Secondary | ICD-10-CM | POA: Diagnosis not present

## 2018-03-19 DIAGNOSIS — C499 Malignant neoplasm of connective and soft tissue, unspecified: Secondary | ICD-10-CM | POA: Diagnosis not present

## 2018-03-19 DIAGNOSIS — M79605 Pain in left leg: Secondary | ICD-10-CM | POA: Diagnosis not present

## 2018-03-19 DIAGNOSIS — Z86018 Personal history of other benign neoplasm: Secondary | ICD-10-CM | POA: Diagnosis not present

## 2018-03-19 DIAGNOSIS — M79662 Pain in left lower leg: Secondary | ICD-10-CM | POA: Diagnosis not present

## 2018-03-19 DIAGNOSIS — J45909 Unspecified asthma, uncomplicated: Secondary | ICD-10-CM | POA: Diagnosis not present

## 2018-03-19 DIAGNOSIS — D1800 Hemangioma unspecified site: Secondary | ICD-10-CM | POA: Diagnosis not present

## 2018-03-19 DIAGNOSIS — Z96649 Presence of unspecified artificial hip joint: Secondary | ICD-10-CM | POA: Diagnosis not present

## 2018-03-19 DIAGNOSIS — M79652 Pain in left thigh: Secondary | ICD-10-CM | POA: Diagnosis not present

## 2018-03-19 DIAGNOSIS — C419 Malignant neoplasm of bone and articular cartilage, unspecified: Secondary | ICD-10-CM | POA: Diagnosis not present

## 2018-07-24 DIAGNOSIS — D1800 Hemangioma unspecified site: Secondary | ICD-10-CM | POA: Diagnosis not present

## 2018-07-24 DIAGNOSIS — Z23 Encounter for immunization: Secondary | ICD-10-CM | POA: Diagnosis not present

## 2018-07-24 DIAGNOSIS — J4540 Moderate persistent asthma, uncomplicated: Secondary | ICD-10-CM | POA: Diagnosis not present

## 2018-07-24 DIAGNOSIS — G90522 Complex regional pain syndrome I of left lower limb: Secondary | ICD-10-CM | POA: Diagnosis not present

## 2018-12-14 DIAGNOSIS — D1800 Hemangioma unspecified site: Secondary | ICD-10-CM | POA: Diagnosis not present

## 2018-12-14 DIAGNOSIS — D1809 Hemangioma of other sites: Secondary | ICD-10-CM | POA: Diagnosis not present

## 2018-12-14 DIAGNOSIS — Z9889 Other specified postprocedural states: Secondary | ICD-10-CM | POA: Diagnosis not present

## 2018-12-14 DIAGNOSIS — J9811 Atelectasis: Secondary | ICD-10-CM | POA: Diagnosis not present

## 2018-12-14 DIAGNOSIS — J45909 Unspecified asthma, uncomplicated: Secondary | ICD-10-CM | POA: Diagnosis not present

## 2018-12-16 DIAGNOSIS — J45909 Unspecified asthma, uncomplicated: Secondary | ICD-10-CM | POA: Diagnosis not present

## 2018-12-16 DIAGNOSIS — J9811 Atelectasis: Secondary | ICD-10-CM | POA: Diagnosis not present

## 2018-12-16 DIAGNOSIS — D1809 Hemangioma of other sites: Secondary | ICD-10-CM | POA: Diagnosis not present

## 2018-12-16 DIAGNOSIS — D1800 Hemangioma unspecified site: Secondary | ICD-10-CM | POA: Diagnosis not present

## 2018-12-16 DIAGNOSIS — C419 Malignant neoplasm of bone and articular cartilage, unspecified: Secondary | ICD-10-CM | POA: Diagnosis not present

## 2018-12-16 DIAGNOSIS — Z96649 Presence of unspecified artificial hip joint: Secondary | ICD-10-CM | POA: Diagnosis not present

## 2018-12-16 DIAGNOSIS — Z9889 Other specified postprocedural states: Secondary | ICD-10-CM | POA: Diagnosis not present

## 2019-02-20 DIAGNOSIS — Z0000 Encounter for general adult medical examination without abnormal findings: Secondary | ICD-10-CM | POA: Diagnosis not present

## 2019-02-20 DIAGNOSIS — D1800 Hemangioma unspecified site: Secondary | ICD-10-CM | POA: Diagnosis not present

## 2019-02-20 DIAGNOSIS — J4540 Moderate persistent asthma, uncomplicated: Secondary | ICD-10-CM | POA: Diagnosis not present

## 2019-02-20 DIAGNOSIS — G90522 Complex regional pain syndrome I of left lower limb: Secondary | ICD-10-CM | POA: Diagnosis not present

## 2019-06-02 ENCOUNTER — Other Ambulatory Visit: Payer: Self-pay | Admitting: Family Medicine

## 2019-06-02 DIAGNOSIS — Z1231 Encounter for screening mammogram for malignant neoplasm of breast: Secondary | ICD-10-CM

## 2019-06-25 DIAGNOSIS — Z23 Encounter for immunization: Secondary | ICD-10-CM | POA: Diagnosis not present

## 2019-07-16 ENCOUNTER — Other Ambulatory Visit: Payer: Self-pay

## 2019-07-16 ENCOUNTER — Ambulatory Visit
Admission: RE | Admit: 2019-07-16 | Discharge: 2019-07-16 | Disposition: A | Discharge status: Home / Self Care | Payer: Medicare Other | Source: Ambulatory Visit | Attending: Family Medicine | Admitting: Family Medicine

## 2019-07-16 DIAGNOSIS — Z1231 Encounter for screening mammogram for malignant neoplasm of breast: Secondary | ICD-10-CM | POA: Diagnosis not present

## 2019-07-30 DIAGNOSIS — H2513 Age-related nuclear cataract, bilateral: Secondary | ICD-10-CM | POA: Diagnosis not present

## 2019-07-30 DIAGNOSIS — H35363 Drusen (degenerative) of macula, bilateral: Secondary | ICD-10-CM | POA: Diagnosis not present

## 2019-09-04 DIAGNOSIS — Z20828 Contact with and (suspected) exposure to other viral communicable diseases: Secondary | ICD-10-CM | POA: Diagnosis not present

## 2019-09-26 ENCOUNTER — Other Ambulatory Visit: Payer: Self-pay | Admitting: Orthopaedic Surgery

## 2019-09-26 ENCOUNTER — Telehealth: Payer: Self-pay | Admitting: Nurse Practitioner

## 2019-09-26 DIAGNOSIS — D1809 Hemangioma of other sites: Secondary | ICD-10-CM

## 2019-09-26 NOTE — Telephone Encounter (Signed)
Phone call to patient to review instructions for 13 hr prep for CT w/ contrast on 11/23/2018 at 1000. Prescription called into [facility] in [facility] Pt aware and verbalized understanding of instructions. Prescription: 2200- 50mg  Prednisone 0400- 50mg  Prednisone 1000- 50mg  Prednisone and 50mg  Benadryl

## 2019-11-24 ENCOUNTER — Ambulatory Visit
Admission: RE | Admit: 2019-11-24 | Discharge: 2019-11-24 | Disposition: A | Discharge status: Home / Self Care | Payer: Medicare Other | Source: Ambulatory Visit | Attending: Orthopaedic Surgery | Admitting: Orthopaedic Surgery

## 2019-11-24 ENCOUNTER — Other Ambulatory Visit: Payer: Self-pay | Admitting: Orthopaedic Surgery

## 2019-11-24 DIAGNOSIS — D1800 Hemangioma unspecified site: Secondary | ICD-10-CM

## 2019-11-24 DIAGNOSIS — N99533 Herniation of continent stoma of urinary tract: Secondary | ICD-10-CM | POA: Diagnosis not present

## 2019-11-24 DIAGNOSIS — Z96642 Presence of left artificial hip joint: Secondary | ICD-10-CM | POA: Diagnosis not present

## 2019-11-24 DIAGNOSIS — Z85830 Personal history of malignant neoplasm of bone: Secondary | ICD-10-CM | POA: Diagnosis not present

## 2019-11-24 DIAGNOSIS — D1809 Hemangioma of other sites: Secondary | ICD-10-CM

## 2019-11-24 DIAGNOSIS — M79605 Pain in left leg: Secondary | ICD-10-CM | POA: Diagnosis not present

## 2019-11-24 MED ORDER — GADOBUTROL 1 MMOL/ML IV SOLN
5.0000 mL | Freq: Once | INTRAVENOUS | Status: AC | PRN
  Administered 2019-11-24: 5 mL via INTRAVENOUS

## 2019-12-15 DIAGNOSIS — D1800 Hemangioma unspecified site: Secondary | ICD-10-CM | POA: Diagnosis not present

## 2019-12-15 DIAGNOSIS — D1801 Hemangioma of skin and subcutaneous tissue: Secondary | ICD-10-CM | POA: Diagnosis not present

## 2020-06-07 DIAGNOSIS — Z0000 Encounter for general adult medical examination without abnormal findings: Secondary | ICD-10-CM | POA: Diagnosis not present

## 2020-06-07 DIAGNOSIS — M792 Neuralgia and neuritis, unspecified: Secondary | ICD-10-CM | POA: Diagnosis not present

## 2020-06-11 DIAGNOSIS — Z0000 Encounter for general adult medical examination without abnormal findings: Secondary | ICD-10-CM | POA: Diagnosis not present

## 2020-06-14 DIAGNOSIS — R269 Unspecified abnormalities of gait and mobility: Secondary | ICD-10-CM | POA: Diagnosis not present

## 2020-06-14 DIAGNOSIS — M792 Neuralgia and neuritis, unspecified: Secondary | ICD-10-CM | POA: Diagnosis not present

## 2020-06-14 DIAGNOSIS — R29898 Other symptoms and signs involving the musculoskeletal system: Secondary | ICD-10-CM | POA: Diagnosis not present

## 2020-06-14 DIAGNOSIS — R6889 Other general symptoms and signs: Secondary | ICD-10-CM | POA: Diagnosis not present

## 2020-06-14 DIAGNOSIS — Z7409 Other reduced mobility: Secondary | ICD-10-CM | POA: Diagnosis not present

## 2020-06-16 DIAGNOSIS — R6889 Other general symptoms and signs: Secondary | ICD-10-CM | POA: Diagnosis not present

## 2020-06-16 DIAGNOSIS — Z7409 Other reduced mobility: Secondary | ICD-10-CM | POA: Diagnosis not present

## 2020-06-16 DIAGNOSIS — R269 Unspecified abnormalities of gait and mobility: Secondary | ICD-10-CM | POA: Diagnosis not present

## 2020-06-16 DIAGNOSIS — R29898 Other symptoms and signs involving the musculoskeletal system: Secondary | ICD-10-CM | POA: Diagnosis not present

## 2020-06-16 DIAGNOSIS — M792 Neuralgia and neuritis, unspecified: Secondary | ICD-10-CM | POA: Diagnosis not present

## 2020-06-22 DIAGNOSIS — R269 Unspecified abnormalities of gait and mobility: Secondary | ICD-10-CM | POA: Diagnosis not present

## 2020-06-22 DIAGNOSIS — Z7409 Other reduced mobility: Secondary | ICD-10-CM | POA: Diagnosis not present

## 2020-06-22 DIAGNOSIS — M792 Neuralgia and neuritis, unspecified: Secondary | ICD-10-CM | POA: Diagnosis not present

## 2020-06-22 DIAGNOSIS — R6889 Other general symptoms and signs: Secondary | ICD-10-CM | POA: Diagnosis not present

## 2020-06-22 DIAGNOSIS — R29898 Other symptoms and signs involving the musculoskeletal system: Secondary | ICD-10-CM | POA: Diagnosis not present

## 2020-06-24 DIAGNOSIS — H524 Presbyopia: Secondary | ICD-10-CM | POA: Diagnosis not present

## 2020-06-24 DIAGNOSIS — H5203 Hypermetropia, bilateral: Secondary | ICD-10-CM | POA: Diagnosis not present

## 2020-06-24 DIAGNOSIS — H35363 Drusen (degenerative) of macula, bilateral: Secondary | ICD-10-CM | POA: Diagnosis not present

## 2020-06-24 DIAGNOSIS — H2513 Age-related nuclear cataract, bilateral: Secondary | ICD-10-CM | POA: Diagnosis not present

## 2020-06-25 DIAGNOSIS — M792 Neuralgia and neuritis, unspecified: Secondary | ICD-10-CM | POA: Diagnosis not present

## 2020-06-25 DIAGNOSIS — R6889 Other general symptoms and signs: Secondary | ICD-10-CM | POA: Diagnosis not present

## 2020-06-25 DIAGNOSIS — R29898 Other symptoms and signs involving the musculoskeletal system: Secondary | ICD-10-CM | POA: Diagnosis not present

## 2020-06-25 DIAGNOSIS — R269 Unspecified abnormalities of gait and mobility: Secondary | ICD-10-CM | POA: Diagnosis not present

## 2020-06-25 DIAGNOSIS — Z7409 Other reduced mobility: Secondary | ICD-10-CM | POA: Diagnosis not present

## 2020-06-28 DIAGNOSIS — E782 Mixed hyperlipidemia: Secondary | ICD-10-CM | POA: Diagnosis not present

## 2020-06-28 DIAGNOSIS — M792 Neuralgia and neuritis, unspecified: Secondary | ICD-10-CM | POA: Diagnosis not present

## 2020-06-28 DIAGNOSIS — Z23 Encounter for immunization: Secondary | ICD-10-CM | POA: Diagnosis not present

## 2020-06-29 DIAGNOSIS — R6889 Other general symptoms and signs: Secondary | ICD-10-CM | POA: Diagnosis not present

## 2020-06-29 DIAGNOSIS — M792 Neuralgia and neuritis, unspecified: Secondary | ICD-10-CM | POA: Diagnosis not present

## 2020-06-29 DIAGNOSIS — R269 Unspecified abnormalities of gait and mobility: Secondary | ICD-10-CM | POA: Diagnosis not present

## 2020-06-29 DIAGNOSIS — Z7409 Other reduced mobility: Secondary | ICD-10-CM | POA: Diagnosis not present

## 2020-06-29 DIAGNOSIS — R29898 Other symptoms and signs involving the musculoskeletal system: Secondary | ICD-10-CM | POA: Diagnosis not present

## 2020-07-01 DIAGNOSIS — R29898 Other symptoms and signs involving the musculoskeletal system: Secondary | ICD-10-CM | POA: Diagnosis not present

## 2020-07-01 DIAGNOSIS — R269 Unspecified abnormalities of gait and mobility: Secondary | ICD-10-CM | POA: Diagnosis not present

## 2020-07-01 DIAGNOSIS — Z7409 Other reduced mobility: Secondary | ICD-10-CM | POA: Diagnosis not present

## 2020-07-01 DIAGNOSIS — R6889 Other general symptoms and signs: Secondary | ICD-10-CM | POA: Diagnosis not present

## 2020-07-01 DIAGNOSIS — M792 Neuralgia and neuritis, unspecified: Secondary | ICD-10-CM | POA: Diagnosis not present

## 2020-07-06 DIAGNOSIS — R6889 Other general symptoms and signs: Secondary | ICD-10-CM | POA: Diagnosis not present

## 2020-07-06 DIAGNOSIS — R269 Unspecified abnormalities of gait and mobility: Secondary | ICD-10-CM | POA: Diagnosis not present

## 2020-07-06 DIAGNOSIS — R29898 Other symptoms and signs involving the musculoskeletal system: Secondary | ICD-10-CM | POA: Diagnosis not present

## 2020-07-06 DIAGNOSIS — M792 Neuralgia and neuritis, unspecified: Secondary | ICD-10-CM | POA: Diagnosis not present

## 2020-07-06 DIAGNOSIS — Z7409 Other reduced mobility: Secondary | ICD-10-CM | POA: Diagnosis not present

## 2020-07-07 DIAGNOSIS — M792 Neuralgia and neuritis, unspecified: Secondary | ICD-10-CM | POA: Diagnosis not present

## 2020-07-07 DIAGNOSIS — Z7409 Other reduced mobility: Secondary | ICD-10-CM | POA: Diagnosis not present

## 2020-07-07 DIAGNOSIS — R269 Unspecified abnormalities of gait and mobility: Secondary | ICD-10-CM | POA: Diagnosis not present

## 2020-07-07 DIAGNOSIS — R6889 Other general symptoms and signs: Secondary | ICD-10-CM | POA: Diagnosis not present

## 2020-07-07 DIAGNOSIS — R29898 Other symptoms and signs involving the musculoskeletal system: Secondary | ICD-10-CM | POA: Diagnosis not present

## 2020-07-13 DIAGNOSIS — R29898 Other symptoms and signs involving the musculoskeletal system: Secondary | ICD-10-CM | POA: Diagnosis not present

## 2020-07-13 DIAGNOSIS — Z7409 Other reduced mobility: Secondary | ICD-10-CM | POA: Diagnosis not present

## 2020-07-13 DIAGNOSIS — R269 Unspecified abnormalities of gait and mobility: Secondary | ICD-10-CM | POA: Diagnosis not present

## 2020-07-13 DIAGNOSIS — R6889 Other general symptoms and signs: Secondary | ICD-10-CM | POA: Diagnosis not present

## 2020-07-13 DIAGNOSIS — M792 Neuralgia and neuritis, unspecified: Secondary | ICD-10-CM | POA: Diagnosis not present

## 2020-07-14 DIAGNOSIS — Z7409 Other reduced mobility: Secondary | ICD-10-CM | POA: Diagnosis not present

## 2020-07-14 DIAGNOSIS — R29898 Other symptoms and signs involving the musculoskeletal system: Secondary | ICD-10-CM | POA: Diagnosis not present

## 2020-07-14 DIAGNOSIS — R269 Unspecified abnormalities of gait and mobility: Secondary | ICD-10-CM | POA: Diagnosis not present

## 2020-07-14 DIAGNOSIS — M792 Neuralgia and neuritis, unspecified: Secondary | ICD-10-CM | POA: Diagnosis not present

## 2020-07-14 DIAGNOSIS — R6889 Other general symptoms and signs: Secondary | ICD-10-CM | POA: Diagnosis not present

## 2020-07-20 DIAGNOSIS — R29898 Other symptoms and signs involving the musculoskeletal system: Secondary | ICD-10-CM | POA: Diagnosis not present

## 2020-07-20 DIAGNOSIS — Z7409 Other reduced mobility: Secondary | ICD-10-CM | POA: Diagnosis not present

## 2020-07-20 DIAGNOSIS — R269 Unspecified abnormalities of gait and mobility: Secondary | ICD-10-CM | POA: Diagnosis not present

## 2020-07-20 DIAGNOSIS — R6889 Other general symptoms and signs: Secondary | ICD-10-CM | POA: Diagnosis not present

## 2020-07-20 DIAGNOSIS — M792 Neuralgia and neuritis, unspecified: Secondary | ICD-10-CM | POA: Diagnosis not present

## 2020-07-21 DIAGNOSIS — R269 Unspecified abnormalities of gait and mobility: Secondary | ICD-10-CM | POA: Diagnosis not present

## 2020-07-21 DIAGNOSIS — M792 Neuralgia and neuritis, unspecified: Secondary | ICD-10-CM | POA: Diagnosis not present

## 2020-07-21 DIAGNOSIS — Z7409 Other reduced mobility: Secondary | ICD-10-CM | POA: Diagnosis not present

## 2020-07-21 DIAGNOSIS — R29898 Other symptoms and signs involving the musculoskeletal system: Secondary | ICD-10-CM | POA: Diagnosis not present

## 2020-07-21 DIAGNOSIS — R6889 Other general symptoms and signs: Secondary | ICD-10-CM | POA: Diagnosis not present

## 2020-07-26 DIAGNOSIS — Z1231 Encounter for screening mammogram for malignant neoplasm of breast: Secondary | ICD-10-CM | POA: Diagnosis not present

## 2020-07-26 DIAGNOSIS — Z23 Encounter for immunization: Secondary | ICD-10-CM | POA: Diagnosis not present

## 2020-07-26 DIAGNOSIS — Z1159 Encounter for screening for other viral diseases: Secondary | ICD-10-CM | POA: Diagnosis not present

## 2020-07-26 DIAGNOSIS — Z0000 Encounter for general adult medical examination without abnormal findings: Secondary | ICD-10-CM | POA: Diagnosis not present

## 2020-07-27 DIAGNOSIS — R6889 Other general symptoms and signs: Secondary | ICD-10-CM | POA: Diagnosis not present

## 2020-07-27 DIAGNOSIS — M792 Neuralgia and neuritis, unspecified: Secondary | ICD-10-CM | POA: Diagnosis not present

## 2020-07-27 DIAGNOSIS — Z7409 Other reduced mobility: Secondary | ICD-10-CM | POA: Diagnosis not present

## 2020-07-27 DIAGNOSIS — R269 Unspecified abnormalities of gait and mobility: Secondary | ICD-10-CM | POA: Diagnosis not present

## 2020-07-27 DIAGNOSIS — R29898 Other symptoms and signs involving the musculoskeletal system: Secondary | ICD-10-CM | POA: Diagnosis not present

## 2020-07-29 DIAGNOSIS — R269 Unspecified abnormalities of gait and mobility: Secondary | ICD-10-CM | POA: Diagnosis not present

## 2020-07-29 DIAGNOSIS — R29898 Other symptoms and signs involving the musculoskeletal system: Secondary | ICD-10-CM | POA: Diagnosis not present

## 2020-07-29 DIAGNOSIS — M792 Neuralgia and neuritis, unspecified: Secondary | ICD-10-CM | POA: Diagnosis not present

## 2020-07-29 DIAGNOSIS — Z7409 Other reduced mobility: Secondary | ICD-10-CM | POA: Diagnosis not present

## 2020-07-29 DIAGNOSIS — R6889 Other general symptoms and signs: Secondary | ICD-10-CM | POA: Diagnosis not present

## 2020-08-03 DIAGNOSIS — R269 Unspecified abnormalities of gait and mobility: Secondary | ICD-10-CM | POA: Diagnosis not present

## 2020-08-03 DIAGNOSIS — M792 Neuralgia and neuritis, unspecified: Secondary | ICD-10-CM | POA: Diagnosis not present

## 2020-08-03 DIAGNOSIS — Z7409 Other reduced mobility: Secondary | ICD-10-CM | POA: Diagnosis not present

## 2020-08-03 DIAGNOSIS — R29898 Other symptoms and signs involving the musculoskeletal system: Secondary | ICD-10-CM | POA: Diagnosis not present

## 2020-08-03 DIAGNOSIS — R6889 Other general symptoms and signs: Secondary | ICD-10-CM | POA: Diagnosis not present

## 2020-08-04 DIAGNOSIS — H16141 Punctate keratitis, right eye: Secondary | ICD-10-CM | POA: Diagnosis not present

## 2020-08-04 DIAGNOSIS — H35363 Drusen (degenerative) of macula, bilateral: Secondary | ICD-10-CM | POA: Diagnosis not present

## 2020-08-04 DIAGNOSIS — H43811 Vitreous degeneration, right eye: Secondary | ICD-10-CM | POA: Diagnosis not present

## 2020-08-05 DIAGNOSIS — Z7409 Other reduced mobility: Secondary | ICD-10-CM | POA: Diagnosis not present

## 2020-08-05 DIAGNOSIS — R6889 Other general symptoms and signs: Secondary | ICD-10-CM | POA: Diagnosis not present

## 2020-08-05 DIAGNOSIS — R269 Unspecified abnormalities of gait and mobility: Secondary | ICD-10-CM | POA: Diagnosis not present

## 2020-08-05 DIAGNOSIS — M792 Neuralgia and neuritis, unspecified: Secondary | ICD-10-CM | POA: Diagnosis not present

## 2020-08-05 DIAGNOSIS — R29898 Other symptoms and signs involving the musculoskeletal system: Secondary | ICD-10-CM | POA: Diagnosis not present

## 2020-08-10 DIAGNOSIS — Z7409 Other reduced mobility: Secondary | ICD-10-CM | POA: Diagnosis not present

## 2020-08-10 DIAGNOSIS — R6889 Other general symptoms and signs: Secondary | ICD-10-CM | POA: Diagnosis not present

## 2020-08-10 DIAGNOSIS — R269 Unspecified abnormalities of gait and mobility: Secondary | ICD-10-CM | POA: Diagnosis not present

## 2020-08-10 DIAGNOSIS — M792 Neuralgia and neuritis, unspecified: Secondary | ICD-10-CM | POA: Diagnosis not present

## 2020-08-10 DIAGNOSIS — R29898 Other symptoms and signs involving the musculoskeletal system: Secondary | ICD-10-CM | POA: Diagnosis not present

## 2020-08-12 DIAGNOSIS — Z7409 Other reduced mobility: Secondary | ICD-10-CM | POA: Diagnosis not present

## 2020-08-12 DIAGNOSIS — M792 Neuralgia and neuritis, unspecified: Secondary | ICD-10-CM | POA: Diagnosis not present

## 2020-08-12 DIAGNOSIS — R29898 Other symptoms and signs involving the musculoskeletal system: Secondary | ICD-10-CM | POA: Diagnosis not present

## 2020-08-12 DIAGNOSIS — R269 Unspecified abnormalities of gait and mobility: Secondary | ICD-10-CM | POA: Diagnosis not present

## 2020-08-12 DIAGNOSIS — R6889 Other general symptoms and signs: Secondary | ICD-10-CM | POA: Diagnosis not present

## 2020-08-17 DIAGNOSIS — R29898 Other symptoms and signs involving the musculoskeletal system: Secondary | ICD-10-CM | POA: Diagnosis not present

## 2020-08-17 DIAGNOSIS — Z7409 Other reduced mobility: Secondary | ICD-10-CM | POA: Diagnosis not present

## 2020-08-17 DIAGNOSIS — R269 Unspecified abnormalities of gait and mobility: Secondary | ICD-10-CM | POA: Diagnosis not present

## 2020-08-17 DIAGNOSIS — M792 Neuralgia and neuritis, unspecified: Secondary | ICD-10-CM | POA: Diagnosis not present

## 2020-08-17 DIAGNOSIS — R6889 Other general symptoms and signs: Secondary | ICD-10-CM | POA: Diagnosis not present

## 2020-08-19 DIAGNOSIS — R6889 Other general symptoms and signs: Secondary | ICD-10-CM | POA: Diagnosis not present

## 2020-08-19 DIAGNOSIS — R269 Unspecified abnormalities of gait and mobility: Secondary | ICD-10-CM | POA: Diagnosis not present

## 2020-08-19 DIAGNOSIS — R29898 Other symptoms and signs involving the musculoskeletal system: Secondary | ICD-10-CM | POA: Diagnosis not present

## 2020-08-19 DIAGNOSIS — M792 Neuralgia and neuritis, unspecified: Secondary | ICD-10-CM | POA: Diagnosis not present

## 2020-08-19 DIAGNOSIS — Z7409 Other reduced mobility: Secondary | ICD-10-CM | POA: Diagnosis not present

## 2020-08-26 DIAGNOSIS — Z1231 Encounter for screening mammogram for malignant neoplasm of breast: Secondary | ICD-10-CM | POA: Diagnosis not present

## 2021-07-18 IMAGING — CR DG PELVIS 1-2V
1 series · 1 of 1 positions shown · non-contrast
Comparison: None.

CLINICAL DATA: History of epithelial hemangioma with chronic left
leg pain

EXAM:
PELVIS - 1-2 VIEW

[t pelvis ap]
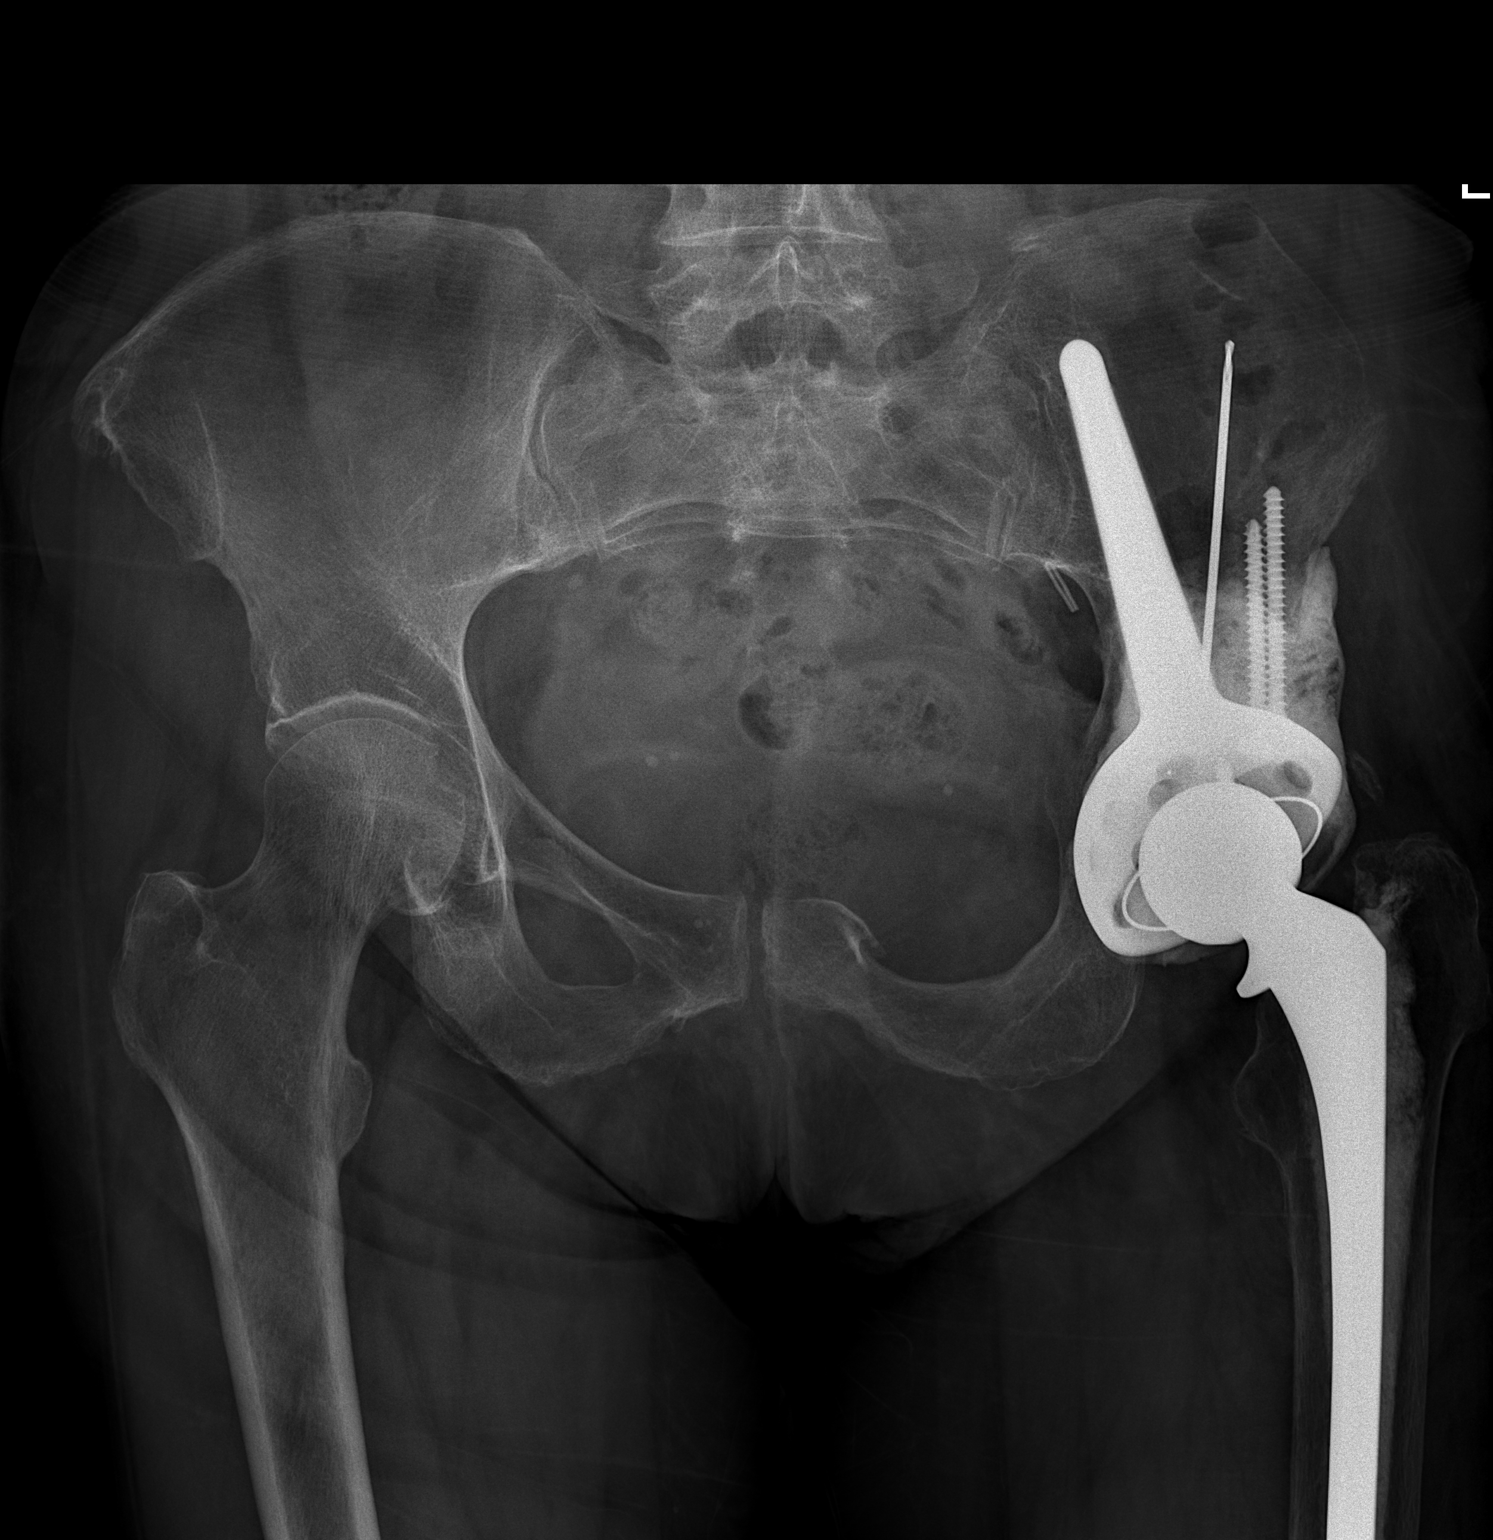

[1 of 1 positions shown; findings below may reference images not displayed]

FINDINGS: Postsurgical changes are noted in the left hip. No acute fracture is
seen. Resection of the superior pubic ramus on the left is noted
consistent with the given clinical history. No soft tissue
abnormality is noted.
IMPRESSION: Postsurgical changes without acute abnormality.
